# Patient Record
Sex: Male | Born: 1935 | Race: White | Hispanic: No | Smoking: Never smoker
Health system: Southern US, Community
[De-identification: ages and names within clinical notes are randomized; demographics above are authoritative.]

## PROBLEM LIST (undated history)

## (undated) DIAGNOSIS — I10 Essential (primary) hypertension: Secondary | ICD-10-CM

## (undated) DIAGNOSIS — I350 Nonrheumatic aortic (valve) stenosis: Secondary | ICD-10-CM

## (undated) DIAGNOSIS — I200 Unstable angina: Secondary | ICD-10-CM

## (undated) DIAGNOSIS — I213 ST elevation (STEMI) myocardial infarction of unspecified site: Secondary | ICD-10-CM

## (undated) DIAGNOSIS — I739 Peripheral vascular disease, unspecified: Secondary | ICD-10-CM

## (undated) DIAGNOSIS — F419 Anxiety disorder, unspecified: Secondary | ICD-10-CM

## (undated) DIAGNOSIS — I70201 Unspecified atherosclerosis of native arteries of extremities, right leg: Secondary | ICD-10-CM

## (undated) DIAGNOSIS — I340 Nonrheumatic mitral (valve) insufficiency: Secondary | ICD-10-CM

## (undated) DIAGNOSIS — N183 Chronic kidney disease, stage 3 unspecified: Secondary | ICD-10-CM

## (undated) DIAGNOSIS — E119 Type 2 diabetes mellitus without complications: Secondary | ICD-10-CM

## (undated) DIAGNOSIS — R5381 Other malaise: Secondary | ICD-10-CM

## (undated) DIAGNOSIS — I255 Ischemic cardiomyopathy: Secondary | ICD-10-CM

## (undated) DIAGNOSIS — E785 Hyperlipidemia, unspecified: Secondary | ICD-10-CM

## (undated) DIAGNOSIS — H40053 Ocular hypertension, bilateral: Secondary | ICD-10-CM

## (undated) DIAGNOSIS — K219 Gastro-esophageal reflux disease without esophagitis: Secondary | ICD-10-CM

## (undated) DIAGNOSIS — I2510 Atherosclerotic heart disease of native coronary artery without angina pectoris: Secondary | ICD-10-CM

## (undated) DIAGNOSIS — I509 Heart failure, unspecified: Secondary | ICD-10-CM

## (undated) DIAGNOSIS — D529 Folate deficiency anemia, unspecified: Secondary | ICD-10-CM

## (undated) HISTORY — PX: AORTIC VALVULOPLASTY: SHX142

---

## 2002-04-02 HISTORY — PX: AORTIC VALVE REPLACEMENT (AVR)/CORONARY ARTERY BYPASS GRAFTING (CABG): SHX5725

## 2015-11-01 ENCOUNTER — Emergency Department (HOSPITAL_COMMUNITY): Payer: Medicare Other

## 2015-11-01 ENCOUNTER — Encounter (HOSPITAL_COMMUNITY): Payer: Self-pay | Admitting: Emergency Medicine

## 2015-11-01 ENCOUNTER — Emergency Department (HOSPITAL_BASED_OUTPATIENT_CLINIC_OR_DEPARTMENT_OTHER)
Admit: 2015-11-01 | Discharge: 2015-11-01 | Disposition: A | Discharge status: Home / Self Care | Payer: Medicare Other | Attending: Emergency Medicine | Admitting: Emergency Medicine

## 2015-11-01 ENCOUNTER — Emergency Department (HOSPITAL_COMMUNITY)
Admission: EM | Admit: 2015-11-01 | Discharge: 2015-11-01 | Disposition: A | Discharge status: Other Acute Inpatient Hospital | Payer: Medicare Other | Attending: Emergency Medicine | Admitting: Emergency Medicine

## 2015-11-01 DIAGNOSIS — E1159 Type 2 diabetes mellitus with other circulatory complications: Secondary | ICD-10-CM | POA: Insufficient documentation

## 2015-11-01 DIAGNOSIS — M79604 Pain in right leg: Secondary | ICD-10-CM | POA: Diagnosis not present

## 2015-11-01 DIAGNOSIS — N183 Chronic kidney disease, stage 3 (moderate): Secondary | ICD-10-CM | POA: Diagnosis not present

## 2015-11-01 DIAGNOSIS — E1122 Type 2 diabetes mellitus with diabetic chronic kidney disease: Secondary | ICD-10-CM | POA: Diagnosis not present

## 2015-11-01 DIAGNOSIS — X58XXXA Exposure to other specified factors, initial encounter: Secondary | ICD-10-CM | POA: Diagnosis not present

## 2015-11-01 DIAGNOSIS — Y939 Activity, unspecified: Secondary | ICD-10-CM | POA: Insufficient documentation

## 2015-11-01 DIAGNOSIS — Y929 Unspecified place or not applicable: Secondary | ICD-10-CM | POA: Insufficient documentation

## 2015-11-01 DIAGNOSIS — I252 Old myocardial infarction: Secondary | ICD-10-CM | POA: Diagnosis not present

## 2015-11-01 DIAGNOSIS — I2510 Atherosclerotic heart disease of native coronary artery without angina pectoris: Secondary | ICD-10-CM | POA: Insufficient documentation

## 2015-11-01 DIAGNOSIS — S8991XA Unspecified injury of right lower leg, initial encounter: Secondary | ICD-10-CM | POA: Diagnosis present

## 2015-11-01 DIAGNOSIS — I130 Hypertensive heart and chronic kidney disease with heart failure and stage 1 through stage 4 chronic kidney disease, or unspecified chronic kidney disease: Secondary | ICD-10-CM | POA: Diagnosis not present

## 2015-11-01 DIAGNOSIS — I509 Heart failure, unspecified: Secondary | ICD-10-CM

## 2015-11-01 DIAGNOSIS — Z951 Presence of aortocoronary bypass graft: Secondary | ICD-10-CM | POA: Insufficient documentation

## 2015-11-01 DIAGNOSIS — Y999 Unspecified external cause status: Secondary | ICD-10-CM | POA: Insufficient documentation

## 2015-11-01 DIAGNOSIS — S8011XA Contusion of right lower leg, initial encounter: Secondary | ICD-10-CM | POA: Diagnosis not present

## 2015-11-01 DIAGNOSIS — I97621 Postprocedural hematoma of a circulatory system organ or structure following other procedure: Secondary | ICD-10-CM | POA: Insufficient documentation

## 2015-11-01 HISTORY — DX: Folate deficiency anemia, unspecified: D52.9

## 2015-11-01 HISTORY — DX: Type 2 diabetes mellitus without complications: E11.9

## 2015-11-01 HISTORY — DX: Atherosclerotic heart disease of native coronary artery without angina pectoris: I25.10

## 2015-11-01 HISTORY — DX: Ischemic cardiomyopathy: I25.5

## 2015-11-01 HISTORY — DX: Chronic kidney disease, stage 3 (moderate): N18.3

## 2015-11-01 HISTORY — DX: Nonrheumatic aortic (valve) stenosis: I35.0

## 2015-11-01 HISTORY — DX: Essential (primary) hypertension: I10

## 2015-11-01 HISTORY — DX: Chronic kidney disease, stage 3 unspecified: N18.30

## 2015-11-01 HISTORY — DX: Other malaise: R53.81

## 2015-11-01 HISTORY — DX: Heart failure, unspecified: I50.9

## 2015-11-01 HISTORY — DX: ST elevation (STEMI) myocardial infarction of unspecified site: I21.3

## 2015-11-01 HISTORY — DX: Nonrheumatic mitral (valve) insufficiency: I34.0

## 2015-11-01 HISTORY — DX: Unstable angina: I20.0

## 2015-11-01 HISTORY — DX: Peripheral vascular disease, unspecified: I73.9

## 2015-11-01 HISTORY — DX: Anxiety disorder, unspecified: F41.9

## 2015-11-01 HISTORY — DX: Gastro-esophageal reflux disease without esophagitis: K21.9

## 2015-11-01 HISTORY — DX: Unspecified atherosclerosis of native arteries of extremities, right leg: I70.201

## 2015-11-01 HISTORY — DX: Hyperlipidemia, unspecified: E78.5

## 2015-11-01 HISTORY — DX: Ocular hypertension, bilateral: H40.053

## 2015-11-01 LAB — CBC WITH DIFFERENTIAL/PLATELET
BASOS PCT: 0 %
Basophils Absolute: 0 10*3/uL (ref 0.0–0.1)
EOS ABS: 0.1 10*3/uL (ref 0.0–0.7)
Eosinophils Relative: 2 %
HCT: 29.6 % — ABNORMAL LOW (ref 39.0–52.0)
HEMOGLOBIN: 9.6 g/dL — AB (ref 13.0–17.0)
LYMPHS ABS: 1.2 10*3/uL (ref 0.7–4.0)
Lymphocytes Relative: 22 %
MCH: 29.7 pg (ref 26.0–34.0)
MCHC: 32.4 g/dL (ref 30.0–36.0)
MCV: 91.6 fL (ref 78.0–100.0)
Monocytes Absolute: 0.5 10*3/uL (ref 0.1–1.0)
Monocytes Relative: 9 %
NEUTROS PCT: 67 %
Neutro Abs: 3.8 10*3/uL (ref 1.7–7.7)
Platelets: 242 10*3/uL (ref 150–400)
RBC: 3.23 MIL/uL — AB (ref 4.22–5.81)
RDW: 14.1 % (ref 11.5–15.5)
WBC: 5.6 10*3/uL (ref 4.0–10.5)

## 2015-11-01 LAB — BASIC METABOLIC PANEL
Anion gap: 7 (ref 5–15)
BUN: 15 mg/dL (ref 6–20)
CHLORIDE: 106 mmol/L (ref 101–111)
CO2: 25 mmol/L (ref 22–32)
Calcium: 9.3 mg/dL (ref 8.9–10.3)
Creatinine, Ser: 1.13 mg/dL (ref 0.61–1.24)
GFR calc non Af Amer: 57 mL/min — ABNORMAL LOW (ref >60)
Glucose, Bld: 103 mg/dL — ABNORMAL HIGH (ref 65–99)
POTASSIUM: 4.3 mmol/L (ref 3.5–5.1)
SODIUM: 138 mmol/L (ref 135–145)

## 2015-11-01 LAB — I-STAT CG4 LACTIC ACID, ED
LACTIC ACID, VENOUS: 0.8 mmol/L (ref 0.5–1.9)
Lactic Acid, Venous: 1.39 mmol/L (ref 0.5–1.9)

## 2015-11-01 LAB — PROTIME-INR
INR: 1.53
PROTHROMBIN TIME: 18.5 s — AB (ref 11.4–15.2)

## 2015-11-01 MED ORDER — SODIUM CHLORIDE 0.9 % IV BOLUS (SEPSIS)
1000.0000 mL | Freq: Once | INTRAVENOUS | Status: AC
  Administered 2015-11-01: 1000 mL via INTRAVENOUS

## 2015-11-01 MED ORDER — IOPAMIDOL (ISOVUE-370) INJECTION 76%
INTRAVENOUS | Status: AC
  Administered 2015-11-01: 100 mL
  Filled 2015-11-01: qty 100

## 2015-11-01 NOTE — ED Notes (Signed)
Pt to CT at this time.

## 2015-11-01 NOTE — ED Notes (Signed)
Dr. Erma Heritage spoke with pt's son and updated him on plan of care.

## 2015-11-01 NOTE — ED Notes (Signed)
Attempted report 3'xs prior to now.  Called report to Charge Nurse Jasmine December, RN

## 2015-11-01 NOTE — Progress Notes (Signed)
VASCULAR LAB PRELIMINARY  PRELIMINARY  PRELIMINARY  PRELIMINARY  Right lower extremity duplex arterial limited has been completed.    Evaluated for pseudoaneurysm exam was limit due to banage in area patent flow in the CFA and SFA. Hematoma above the arterial vessels. No evidence of pseudoaneurysm.    Antone Summons, RVT, RDMS 11/01/2015, 5:50 PM

## 2015-11-01 NOTE — ED Notes (Signed)
Pt continues to deny any pain or discomfort,

## 2015-11-01 NOTE — ED Provider Notes (Signed)
Patient reassessed for renewal of EMTALA. Patient is alert and nontoxic. No respiratory distress. Both feet are warm and dry. Right foot has diminished distal pedal pulse. Foot is not cyanotic or cool. No ischemic changes. Patient has no additional complaints at this time. He is stable for transfer.   Arby Barrette, MD 11/01/15 2102

## 2015-11-01 NOTE — ED Triage Notes (Signed)
Pt from [facility] via [place] with c/o right leg pain and numbness s/p post op valvuloplasty on 10-16-15.  Pt reports pain is worse with movement.  Denies pain when sitting still.  12 lead EKG unremarkable.  NAD, A&O.

## 2015-11-01 NOTE — ED Provider Notes (Signed)
[facility]-EMERGENCY DEPT Provider Note   CSN: [id number] Arrival date & time: 11/01/15  1158  First Provider Contact:  First MD Initiated Contact with Patient 11/01/15 1229        History   Chief Complaint Chief Complaint  Patient presents with  . Post-op Problem    HPI Charles Cordova is a 82 y.o. male.  HPI   82 year old male with complicated past medical history including recent aortic valve repair via right femoral approach, complicated by pseudoaneurysm formation and thrombus requiring thrombectomy by vascular surgery, who presents with increasing pain and swelling of his right leg. The patient is a difficult historian, but states that he has noticed his right groin has had increased swelling over the last 24 hours. Denies any trauma to the area. Since then, he reports worsening leg pain and numbness. This has been chronic since his surgery, but it has worsened in the last 24 hours. He has also noticed a small amount of bleeding from his operative site. Denies any new weakness of the leg.  Past Medical History:  Diagnosis Date  . Anxiety   . Bilateral ocular hypertension   . CAD (coronary artery disease)   . CHF (congestive heart failure) (HCC) 11/01/2015   EF 30-35%  . CKD (chronic kidney disease), stage III   . Debility   . Diabetes mellitus without complication (HCC)   . Folate deficiency anemia   . GERD (gastroesophageal reflux disease)   . Hyperlipidemia   . Hypertension   . Ischemic cardiomyopathy   . Moderate mitral regurgitation   . Popliteal artery occlusion, right (HCC)   . PVD (peripheral vascular disease) (HCC)   . Severe aortic stenosis   . STEMI (ST elevation myocardial infarction) (HCC)   . Type 2 diabetes mellitus (HCC)   . Unstable angina (HCC)     There are no active problems to display for this patient.   Past Surgical History:  Procedure Laterality Date  . AORTIC VALVE REPLACEMENT (AVR)/CORONARY ARTERY BYPASS GRAFTING (CABG)  2004  . AORTIC  VALVULOPLASTY     severe aortic stenosis status post valvuloplasty 10-16-15       Home Medications    Prior to Admission medications   Not on File    Family History History reviewed. No pertinent family history.  Social History Social History  Substance Use Topics  . Smoking status: Never Smoker  . Smokeless tobacco: Never Used  . Alcohol use No     Allergies   Review of patient's allergies indicates no known allergies.   Review of Systems Review of Systems  Constitutional: Negative for chills, fatigue and fever.  HENT: Negative for congestion and rhinorrhea.   Eyes: Negative for visual disturbance.  Respiratory: Negative for cough, shortness of breath and wheezing.   Cardiovascular: Positive for leg swelling. Negative for chest pain.  Gastrointestinal: Negative for abdominal pain, diarrhea, nausea and vomiting.  Genitourinary: Negative for dysuria and flank pain.  Musculoskeletal: Positive for gait problem. Negative for neck pain and neck stiffness.  Skin: Negative for rash and wound.  Allergic/Immunologic: Negative for immunocompromised state.  Neurological: Positive for numbness. Negative for syncope, weakness and headaches.     Physical Exam Updated Vital Signs BP 144/74   Pulse 105   Temp 97.8 F (36.6 C) (Oral)   Resp 14   Wt 154 lb (69.9 kg)   SpO2 99%   Physical Exam  Constitutional: He is oriented to person, place, and time. He appears well-developed and well-nourished. No distress.  HENT:  Head: Normocephalic and atraumatic.  Mouth/Throat: Oropharynx is clear and moist.  Eyes: Conjunctivae are normal. Pupils are equal, round, and reactive to light.  Neck: Neck supple.  Cardiovascular: Normal rate, regular rhythm and normal heart sounds.  Exam reveals no friction rub.   No murmur heard. Pulmonary/Chest: Effort normal and breath sounds normal. No respiratory distress. He has no wheezes. He has no rales.  Abdominal: He exhibits no distension.  There is no tenderness.  Musculoskeletal: He exhibits no edema.  Neurological: He is alert and oriented to person, place, and time. He exhibits normal muscle tone.  Skin: Skin is warm. Capillary refill takes less than 2 seconds.  Nursing note and vitals reviewed.   LOWER EXTREMITY EXAM: RIGHT  INSPECTION & PALPATION: Large hematoma at operative site overlying femoral artery. Surrounding ecchymoses. Small volume dark red, dried blood with no active bleeding. Skin sutures intact. No erythema or purulence.  SENSORY: sensation is intact to light touch in:  Superficial peroneal nerve distribution (over dorsum of foot) Deep peroneal nerve distribution (over first dorsal web space) Sural nerve distribution (over lateral aspect 5th metatarsal) Saphenous nerve distribution (over medial instep)  MOTOR:  + Motor EHL (great toe dorsiflexion) + FHL (great toe plantar flexion)  + TA (ankle dorsiflexion)  + GSC (ankle plantar flexion)  VASCULAR: Doppler-able DP pulse, venous flow in PT area Toes warm, well-perfused  COMPARTMENTS: Soft, warm, well-perfused No pain with passive extension No parethesias    ED Treatments / Results  Labs (all labs ordered are listed, but only abnormal results are displayed) Labs Reviewed  CBC WITH DIFFERENTIAL/PLATELET - Abnormal; Notable for the following:       Result Value   RBC 3.23 (*)    Hemoglobin 9.6 (*)    HCT 29.6 (*)    All other components within normal limits  BASIC METABOLIC PANEL - Abnormal; Notable for the following:    Glucose, Bld 103 (*)    GFR calc non Af Amer 57 (*)    All other components within normal limits  PROTIME-INR - Abnormal; Notable for the following:    Prothrombin Time 18.5 (*)    All other components within normal limits  I-STAT CG4 LACTIC ACID, ED  I-STAT CG4 LACTIC ACID, ED    EKG  EKG Interpretation None       Radiology Ct Angio Aortobifemoral W And/or Wo Contrast  Result Date: 11/01/2015 CLINICAL  DATA:  Right groin pain. Status post recent thrombectomy at [facility]. Enlarging right groin hematoma. EXAM: CT ANGIOGRAPHY OF ABDOMINAL AORTA WITH ILIOFEMORAL RUNOFF TECHNIQUE: Multidetector CT imaging of the abdomen, pelvis and lower extremities was performed using the standard protocol during bolus administration of intravenous contrast. Multiplanar CT image reconstructions and MIPs were obtained to evaluate the vascular anatomy. CONTRAST:  100 mL Isovue 370 COMPARISON:  None. FINDINGS: Vascular Aorta: The abdominal aorta has diffuse atherosclerotic disease but no aneurysm or dissection. Celiac trunk, SMA and IMA are patent. Bilateral renal arteries are patent. There is at least mild stenosis in the proximal right renal artery due to mixed plaque. Right lower extremity: Atherosclerotic disease in the right iliac arteries. No significant stenosis in the right common or right external iliac artery. There is a hematoma in the right groin compatible with recent surgery. Hematoma in the right groin that does not extend into the intra-abdominal cavity. This hematoma measures 9.6 x 5.9 x 6.6 cm. There is concern for a small pseudoaneurysm or focus of bleeding along the inferior aspect of  this hematoma on sequence 5, image 136. This area measures roughly 1 cm in size. Mild narrowing in the right common femoral artery due to plaque. The right profunda femoral artery is heavily calcified but patent. Right SFA has diffuse calcifications but it is patent. Right popliteal artery is heavily calcified and occludes just below the knee. Right popliteal artery occludes approximately 4 cm proximal to the anterior tibial artery. Difficult to evaluate patency of the runoff vessels due to the circumferential calcifications in these vessels. There does not appear to be significant flow in the posterior tibial artery. Limited evaluation of the pedal arteries due to circumferential calcifications. The appears to be some flow in the dorsalis  pedis artery. Left lower extremity: Calcifications in the left common iliac artery without significant stenosis. Diffuse calcifications involving the left internal iliac artery. The left external iliac artery is widely patent. Left common femoral artery is heavily calcified but patent. Left profunda femoral arteries are heavily calcified but patent. There is at least mild stenosis in the proximal left SFA. Left SFA is patent with diffuse calcifications. Left popliteal artery is patent with diffuse disease. Limited evaluation of the runoff vessels due to diffuse calcifications. There appears to be flow in the distal anterior tibial artery and dorsalis pedis artery. Non Vascular Lung bases: Small left pleural effusion. Compressive atelectasis at the left lung base. Trace amount of right pleural fluid. Hepatic/biliary: Gallbladder has been removed. There is a 5.5 cm low-density structure in the left hepatic lobe which probably represents a large hepatic cyst. Main portal venous system is patent. Pancreas: Normal appearance of the pancreas without inflammation or duct dilatation. Spleen: Normal appearance of spleen without enlargement. Adrenal/ urinary tract: Normal adrenal glands. Normal appearance of both kidneys without hydronephrosis. Normal appearance of the urinary bladder. Reproductive: Large prostate measuring 7.3 x 5.0 x 5.4 cm. Seminal vesicles are prominent. Bowel structures: Small hiatal hernia. No evidence for bowel obstruction. Lymphatics:  No suspicious lymphadenopathy. Other:  No free fluid. Musculoskeletal:  No acute abnormality. IMPRESSION: Large hematoma in the right groin related to recent surgery. There is a 1 cm focus of contrast along the inferior aspect of the hematoma. Based on history of enlarging hematoma, this could represent a small pseudoaneurysm. Recommend further characterization with a vascular ultrasound. Diffuse atherosclerotic disease throughout the abdomen, pelvis and lower  extremities. Thrombosis of the distal right popliteal artery with limited evaluation of the runoff arteries due to heavily calcified runoff vessels. Left inflow and outflow vessels are patent with diffuse atherosclerotic disease. Limited evaluation of left runoff vessels due to heavily calcified arteries. Left pleural effusion with compressive atelectasis. Probable large hepatic cyst. These results were called by telephone at the time of interpretation on 11/01/2015 at 4:53 pm to Dr. Shaune Pollack , who verbally acknowledged these results. Electronically Signed   By: Richarda Overlie M.D.   On: 11/01/2015 17:00    Procedures Procedures (including critical care time)  Medications Ordered in ED Medications  sodium chloride 0.9 % bolus 1,000 mL (0 mLs Intravenous Stopped 11/01/15 1808)  iopamidol (ISOVUE-370) 76 % injection (100 mLs  Contrast Given 11/01/15 1540)     Initial Impression / Assessment and Plan / ED Course  I have reviewed the triage vital signs and the nursing notes.  Pertinent labs & imaging results that were available during my care of the patient were reviewed by me and considered in my medical decision making (see chart for details).  Clinical Course    82 yo M  s/p recent aortic valvuloplasty, c/b pseudoaneurysm and clot formation at R femoral access site, s/p thrombectomy with [facility] 10/25/15 who p/w worsening pain and enlarging hematoma to R groin site. See HPI above. On arrival, VSS and WNL. Exam is as above. Pt has warm toes, intact DP pulse but non-palpable PT pulse. No signs of acute limb ischemia. Overlying skin is not tight or compromised. Will obtain CT A/P for further assessment.  CT abdomen pelvis concerning for small amount of contrast blush along inferior aspect of the hematoma. Discussed with radiology. Findings concerning for possible expanding hematoma versus pseudoaneurysm. Vascular ultrasound performed shows no evidence of any pseudoaneurysm, however. Lab  work otherwise as above. Hemoglobin has dropped approximately 1 point from outside labs. I discussed patient's recent surgery, labs, and imaging with vascular surgeon on call, Dr. Edilia Bo. Given recent surgery at [facility] with complicated post-op course, he feels patient would be better served at [facility], which is appropriate. Medical screening exam shows no acute, life-threatening emergency. Discussed case with Dr. Sharee Holster with [facility], who has graciously accepted. Patient's son, Charles Cordova is aware and also in agreement with transfer. Discussed with Dr. Debarah Crape of [facility], who has accepted. Patient will be transferred for further management.  Final Clinical Impressions(s) / ED Diagnoses   Final diagnoses:  Hematoma of leg, right, initial encounter    New Prescriptions New Prescriptions   No medications on file     Shaune Pollack, MD 11/01/15 1920

## 2015-11-02 LAB — VAS US LOWER EXTREMITY ARTERIAL DUPLEX
RIGHT SUPER FEMORAL PROX EDV: 3 cm/s
RSFPPSV: 63 cm/s

## 2015-11-09 ENCOUNTER — Encounter (HOSPITAL_COMMUNITY): Payer: Self-pay | Admitting: Emergency Medicine

## 2015-11-09 ENCOUNTER — Emergency Department (HOSPITAL_COMMUNITY)
Admission: EM | Admit: 2015-11-09 | Discharge: 2015-11-10 | Disposition: A | Discharge status: Other Acute Inpatient Hospital | Payer: Medicare Other | Attending: Emergency Medicine | Admitting: Emergency Medicine

## 2015-11-09 DIAGNOSIS — R58 Hemorrhage, not elsewhere classified: Secondary | ICD-10-CM

## 2015-11-09 DIAGNOSIS — I130 Hypertensive heart and chronic kidney disease with heart failure and stage 1 through stage 4 chronic kidney disease, or unspecified chronic kidney disease: Secondary | ICD-10-CM | POA: Insufficient documentation

## 2015-11-09 DIAGNOSIS — I252 Old myocardial infarction: Secondary | ICD-10-CM | POA: Diagnosis not present

## 2015-11-09 DIAGNOSIS — N183 Chronic kidney disease, stage 3 (moderate): Secondary | ICD-10-CM | POA: Diagnosis not present

## 2015-11-09 DIAGNOSIS — D649 Anemia, unspecified: Secondary | ICD-10-CM | POA: Diagnosis not present

## 2015-11-09 DIAGNOSIS — M96831 Postprocedural hemorrhage and hematoma of a musculoskeletal structure following other procedure: Secondary | ICD-10-CM | POA: Insufficient documentation

## 2015-11-09 DIAGNOSIS — Z951 Presence of aortocoronary bypass graft: Secondary | ICD-10-CM | POA: Insufficient documentation

## 2015-11-09 DIAGNOSIS — I2510 Atherosclerotic heart disease of native coronary artery without angina pectoris: Secondary | ICD-10-CM | POA: Diagnosis not present

## 2015-11-09 DIAGNOSIS — I509 Heart failure, unspecified: Secondary | ICD-10-CM | POA: Diagnosis not present

## 2015-11-09 DIAGNOSIS — E1122 Type 2 diabetes mellitus with diabetic chronic kidney disease: Secondary | ICD-10-CM | POA: Insufficient documentation

## 2015-11-09 LAB — CBC WITH DIFFERENTIAL/PLATELET
Basophils Absolute: 0 10*3/uL (ref 0.0–0.1)
Basophils Relative: 0 %
EOS ABS: 0.3 10*3/uL (ref 0.0–0.7)
Eosinophils Relative: 7 %
HCT: 31.3 % — ABNORMAL LOW (ref 39.0–52.0)
Hemoglobin: 10.1 g/dL — ABNORMAL LOW (ref 13.0–17.0)
LYMPHS ABS: 1.5 10*3/uL (ref 0.7–4.0)
Lymphocytes Relative: 29 %
MCH: 29.4 pg (ref 26.0–34.0)
MCHC: 32.3 g/dL (ref 30.0–36.0)
MCV: 91.3 fL (ref 78.0–100.0)
Monocytes Absolute: 0.6 10*3/uL (ref 0.1–1.0)
Monocytes Relative: 12 %
NEUTROS ABS: 2.7 10*3/uL (ref 1.7–7.7)
NEUTROS PCT: 52 %
Platelets: 225 10*3/uL (ref 150–400)
RBC: 3.43 MIL/uL — AB (ref 4.22–5.81)
RDW: 14.2 % (ref 11.5–15.5)
WBC: 5.1 10*3/uL (ref 4.0–10.5)

## 2015-11-09 LAB — I-STAT CHEM 8, ED
BUN: 25 mg/dL — ABNORMAL HIGH (ref 6–20)
CREATININE: 1.3 mg/dL — AB (ref 0.61–1.24)
Calcium, Ion: 1.16 mmol/L (ref 1.12–1.23)
Chloride: 101 mmol/L (ref 101–111)
Glucose, Bld: 108 mg/dL — ABNORMAL HIGH (ref 65–99)
HEMATOCRIT: 30 % — AB (ref 39.0–52.0)
HEMOGLOBIN: 10.2 g/dL — AB (ref 13.0–17.0)
Potassium: 4.7 mmol/L (ref 3.5–5.1)
SODIUM: 140 mmol/L (ref 135–145)
TCO2: 27 mmol/L (ref 0–100)

## 2015-11-09 LAB — PROTIME-INR
INR: 1.3
Prothrombin Time: 16.3 seconds — ABNORMAL HIGH (ref 11.4–15.2)

## 2015-11-09 MED ORDER — ACETAMINOPHEN 500 MG PO TABS: 500.0000 mg | ORAL_TABLET | Freq: Once | ORAL | Status: DC

## 2015-11-09 MED ORDER — SODIUM CHLORIDE 0.9 % IV BOLUS (SEPSIS)
500.0000 mL | Freq: Once | INTRAVENOUS | Status: AC
  Administered 2015-11-09: 500 mL via INTRAVENOUS

## 2015-11-09 MED ORDER — ACETAMINOPHEN 325 MG PO TABS
650.0000 mg | ORAL_TABLET | Freq: Once | ORAL | Status: AC
  Administered 2015-11-09: 650 mg via ORAL
  Filled 2015-11-09: qty 2

## 2015-11-09 NOTE — ED Triage Notes (Signed)
[facility], ems reports pt had surgery for a blood clot removal from inner right thigh 9 days ago, upon staff changing the dressing this am, they reported seeing blood on patient's dressing. Ems reports pt is on blood thinners. Ems reports pt reports some discomfort of leg that he's been experiencing since surgery, no other complaints.

## 2015-11-09 NOTE — ED Notes (Signed)
Dr. Ethelda ChickJacubowitz advised this RN that after patient's lab results come back, [facility] will [facility] in order to transfer patient there.

## 2015-11-09 NOTE — ED Notes (Signed)
Notified DR J of pt's vitals, pressure is slightly soft, given order to give 500cc NS.

## 2015-11-09 NOTE — ED Provider Notes (Addendum)
[facility]-EMERGENCY DEPT Provider Note   CSN: [id number] Arrival date & time: 11/09/15  1707  First Provider Contact:  First MD Initiated Contact with Patient 11/09/15 1747        History   Chief Complaint Chief Complaint  Patient presents with  . Post-op Problem  Patient is an extremely vague historian. He presents to the emergency Department complaining of bleeding from his surgical site at right groin which occurred today. He complains of right foot pain for the past several days. Patient reportedly soaked through 2 dressings per day per nurse's note from [facility]. While he remained hemodynamically stable. He was instructed to go to [facility] emergency Department earlier today. No treatment prior to coming here. Nothing makes symptoms better or worse.  HPI Charles Cordova is a 82 y.o. male.  HPI  Past Medical History:  Diagnosis Date  . Anxiety   . Bilateral ocular hypertension   . CAD (coronary artery disease)   . CHF (congestive heart failure) (HCC) 11/01/2015   EF 30-35%  . CKD (chronic kidney disease), stage III   . Debility   . Diabetes mellitus without complication (HCC)   . Folate deficiency anemia   . GERD (gastroesophageal reflux disease)   . Hyperlipidemia   . Hypertension   . Ischemic cardiomyopathy   . Moderate mitral regurgitation   . Popliteal artery occlusion, right (HCC)   . PVD (peripheral vascular disease) (HCC)   . Severe aortic stenosis   . STEMI (ST elevation myocardial infarction) (HCC)   . Type 2 diabetes mellitus (HCC)   . Unstable angina (HCC)     There are no active problems to display for this patient.   Past Surgical History:  Procedure Laterality Date  . AORTIC VALVE REPLACEMENT (AVR)/CORONARY ARTERY BYPASS GRAFTING (CABG)  2004  . AORTIC VALVULOPLASTY     severe aortic stenosis status post valvuloplasty 10-16-15       Home Medications    Prior to Admission medications   Not on File    Family History No family history on file.  Social  History Social History  Substance Use Topics  . Smoking status: Never Smoker  . Smokeless tobacco: Never Used  . Alcohol use No     Allergies   Review of patient's allergies indicates no known allergies.   Review of Systems Review of Systems  Unable to perform ROS: Other  Musculoskeletal: Positive for arthralgias.       Right foot pain  Hematological: Bruises/bleeds easily.   Unable to perform complete review of systems as patient is extremely vague historian  Physical Exam Updated Vital Signs BP (!) 124/51   Pulse 71   Temp 97.8 F (36.6 C)   Resp 18   SpO2 100%   Physical Exam  Constitutional: He appears well-developed and well-nourished.  HENT:  Head: Normocephalic and atraumatic.  Eyes: Conjunctivae are normal. Pupils are equal, round, and reactive to light.  Neck: Neck supple. No tracheal deviation present. No thyromegaly present.  Cardiovascular: Normal rate and regular rhythm.   No murmur heard. Pulmonary/Chest: Effort normal and breath sounds normal.  Abdominal: Soft. Bowel sounds are normal. He exhibits no distension. There is no tenderness.  Musculoskeletal: Normal range of motion. He exhibits no edema or tenderness.  Right lower extremity Clean-appearing surgical wound at right groin with slight amount of fresh blood on wound. No active bleeding. Femoral pulses 2+ DP and PT pulses absent. Left lower extremity PT pulses obtainable by Doppler only. DP pulses absent. Femoral pulses  2+. There is no temperature differential between right and left lower extremities  Neurological: He is alert. Coordination normal.  Skin: Skin is warm and dry. No rash noted.  Psychiatric: He has a normal mood and affect.  Nursing note and vitals reviewed.    ED Treatments / Results  Labs (all labs ordered are listed, but only abnormal results are displayed) Labs Reviewed  CBC WITH DIFFERENTIAL/PLATELET  PROTIME-INR  I-STAT CHEM 8, ED    EKG  EKG Interpretation None        Radiology No results found.  Procedures Procedures (including critical care time)  Medications Ordered in ED Medications - No data to display Patient administered Tylenol for foot pain.  Initial Impression / Assessment and Plan / ED Course  I have reviewed the triage vital signs and the nursing notes.  Pertinent labs & imaging results that were available during my care of the patient were reviewed by me and considered in my medical decision making (see chart for details).  Clinical Course   Results for orders placed or performed during the hospital encounter of 11/09/15  CBC with Differential/Platelet  Result Value Ref Range   WBC 5.1 4.0 - 10.5 K/uL   RBC 3.43 (L) 4.22 - 5.81 MIL/uL   Hemoglobin 10.1 (L) 13.0 - 17.0 g/dL   HCT 96.0 (L) 45.4 - 09.8 %   MCV 91.3 78.0 - 100.0 fL   MCH 29.4 26.0 - 34.0 pg   MCHC 32.3 30.0 - 36.0 g/dL   RDW 11.9 14.7 - 82.9 %   Platelets 225 150 - 400 K/uL   Neutrophils Relative % 52 %   Neutro Abs 2.7 1.7 - 7.7 K/uL   Lymphocytes Relative 29 %   Lymphs Abs 1.5 0.7 - 4.0 K/uL   Monocytes Relative 12 %   Monocytes Absolute 0.6 0.1 - 1.0 K/uL   Eosinophils Relative 7 %   Eosinophils Absolute 0.3 0.0 - 0.7 K/uL   Basophils Relative 0 %   Basophils Absolute 0.0 0.0 - 0.1 K/uL  Protime-INR  Result Value Ref Range   Prothrombin Time 16.3 (H) 11.4 - 15.2 seconds   INR 1.30   I-stat chem 8, ed  Result Value Ref Range   Sodium 140 135 - 145 mmol/L   Potassium 4.7 3.5 - 5.1 mmol/L   Chloride 101 101 - 111 mmol/L   BUN 25 (H) 6 - 20 mg/dL   Creatinine, Ser 5.62 (H) 0.61 - 1.24 mg/dL   Glucose, Bld 130 (H) 65 - 99 mg/dL   Calcium, Ion 8.65 7.84 - 1.23 mmol/L   TCO2 27 0 - 100 mmol/L   Hemoglobin 10.2 (L) 13.0 - 17.0 g/dL   HCT 69.6 (L) 29.5 - 28.4 %   Ct Angio Aortobifemoral W And/or Wo Contrast  Result Date: 11/01/2015 CLINICAL DATA:  Right groin pain. Status post recent thrombectomy at [facility]. Enlarging right groin hematoma. EXAM: CT  ANGIOGRAPHY OF ABDOMINAL AORTA WITH ILIOFEMORAL RUNOFF TECHNIQUE: Multidetector CT imaging of the abdomen, pelvis and lower extremities was performed using the standard protocol during bolus administration of intravenous contrast. Multiplanar CT image reconstructions and MIPs were obtained to evaluate the vascular anatomy. CONTRAST:  100 mL Isovue 370 COMPARISON:  None. FINDINGS: Vascular Aorta: The abdominal aorta has diffuse atherosclerotic disease but no aneurysm or dissection. Celiac trunk, SMA and IMA are patent. Bilateral renal arteries are patent. There is at least mild stenosis in the proximal right renal artery due to mixed plaque. Right lower extremity: Atherosclerotic  disease in the right iliac arteries. No significant stenosis in the right common or right external iliac artery. There is a hematoma in the right groin compatible with recent surgery. Hematoma in the right groin that does not extend into the intra-abdominal cavity. This hematoma measures 9.6 x 5.9 x 6.6 cm. There is concern for a small pseudoaneurysm or focus of bleeding along the inferior aspect of this hematoma on sequence 5, image 136. This area measures roughly 1 cm in size. Mild narrowing in the right common femoral artery due to plaque. The right profunda femoral artery is heavily calcified but patent. Right SFA has diffuse calcifications but it is patent. Right popliteal artery is heavily calcified and occludes just below the knee. Right popliteal artery occludes approximately 4 cm proximal to the anterior tibial artery. Difficult to evaluate patency of the runoff vessels due to the circumferential calcifications in these vessels. There does not appear to be significant flow in the posterior tibial artery. Limited evaluation of the pedal arteries due to circumferential calcifications. The appears to be some flow in the dorsalis pedis artery. Left lower extremity: Calcifications in the left common iliac artery without significant  stenosis. Diffuse calcifications involving the left internal iliac artery. The left external iliac artery is widely patent. Left common femoral artery is heavily calcified but patent. Left profunda femoral arteries are heavily calcified but patent. There is at least mild stenosis in the proximal left SFA. Left SFA is patent with diffuse calcifications. Left popliteal artery is patent with diffuse disease. Limited evaluation of the runoff vessels due to diffuse calcifications. There appears to be flow in the distal anterior tibial artery and dorsalis pedis artery. Non Vascular Lung bases: Small left pleural effusion. Compressive atelectasis at the left lung base. Trace amount of right pleural fluid. Hepatic/biliary: Gallbladder has been removed. There is a 5.5 cm low-density structure in the left hepatic lobe which probably represents a large hepatic cyst. Main portal venous system is patent. Pancreas: Normal appearance of the pancreas without inflammation or duct dilatation. Spleen: Normal appearance of spleen without enlargement. Adrenal/ urinary tract: Normal adrenal glands. Normal appearance of both kidneys without hydronephrosis. Normal appearance of the urinary bladder. Reproductive: Large prostate measuring 7.3 x 5.0 x 5.4 cm. Seminal vesicles are prominent. Bowel structures: Small hiatal hernia. No evidence for bowel obstruction. Lymphatics:  No suspicious lymphadenopathy. Other:  No free fluid. Musculoskeletal:  No acute abnormality. IMPRESSION: Large hematoma in the right groin related to recent surgery. There is a 1 cm focus of contrast along the inferior aspect of the hematoma. Based on history of enlarging hematoma, this could represent a small pseudoaneurysm. Recommend further characterization with a vascular ultrasound. Diffuse atherosclerotic disease throughout the abdomen, pelvis and lower extremities. Thrombosis of the distal right popliteal artery with limited evaluation of the runoff arteries due  to heavily calcified runoff vessels. Left inflow and outflow vessels are patent with diffuse atherosclerotic disease. Limited evaluation of left runoff vessels due to heavily calcified arteries. Left pleural effusion with compressive atelectasis. Probable large hepatic cyst. These results were called by telephone at the time of interpretation on 11/01/2015 at 4:53 pm to Dr. Shaune PollackAMERON ISAACS , who verbally acknowledged these results. Electronically Signed   By: Richarda OverlieAdam  Henn M.D.   On: 11/01/2015 17:00   I consulted Dr. Margo AyeHall, [facility] He states that patient's right foot pain is baseline for him. Likely the patient bled from hematoma. He had from right groin  pseudoaneurysm evacuated 10/24/2015. I also spoke with Dr. Shella Spearing [facility] for, [facility] emergency physician who accepts patient in transfer 10:55 PM pain is improved after treatment with Tylenol. Hemoglobin is stable for this patient Final Clinical Impressions(s) / ED Diagnoses  Diagnosis#1 postoperative bleeding #2 anemia Final diagnoses:  None    New Prescriptions New Prescriptions   No medications on file     Doug Sou, MD 11/09/15 2258 Addendum 11:40 PM patient's blood pressure to be 88/56 with pulse of 70. We will gently hydrateWith 500 mL intravenous saline bolus. He's had no further bleeding from his groin   Doug Sou, MD 11/09/15 2345    Doug Sou, MD 11/09/15 [phone]

## 2015-11-10 DIAGNOSIS — N183 Chronic kidney disease, stage 3 (moderate): Secondary | ICD-10-CM | POA: Diagnosis not present

## 2015-11-10 DIAGNOSIS — Z951 Presence of aortocoronary bypass graft: Secondary | ICD-10-CM | POA: Diagnosis not present

## 2015-11-10 DIAGNOSIS — I130 Hypertensive heart and chronic kidney disease with heart failure and stage 1 through stage 4 chronic kidney disease, or unspecified chronic kidney disease: Secondary | ICD-10-CM | POA: Diagnosis not present

## 2015-11-10 DIAGNOSIS — I509 Heart failure, unspecified: Secondary | ICD-10-CM | POA: Diagnosis not present

## 2015-11-10 DIAGNOSIS — E1122 Type 2 diabetes mellitus with diabetic chronic kidney disease: Secondary | ICD-10-CM | POA: Diagnosis not present

## 2015-11-10 DIAGNOSIS — I2510 Atherosclerotic heart disease of native coronary artery without angina pectoris: Secondary | ICD-10-CM | POA: Diagnosis not present

## 2015-11-10 DIAGNOSIS — M96831 Postprocedural hemorrhage and hematoma of a musculoskeletal structure following other procedure: Secondary | ICD-10-CM | POA: Diagnosis present

## 2015-11-10 DIAGNOSIS — I252 Old myocardial infarction: Secondary | ICD-10-CM | POA: Diagnosis not present

## 2015-11-10 DIAGNOSIS — D649 Anemia, unspecified: Secondary | ICD-10-CM | POA: Diagnosis not present

## 2016-07-01 DEATH — deceased

## 2017-07-16 IMAGING — CT CT ANGIO AOBIFEM WO/W CM
2 of 11 series · 12 of 46 positions shown, 14 images · IV contrast (isovue)
Comparison: None.

CLINICAL DATA: Right groin pain. Status post recent thrombectomy at
[HOSPITAL]. Enlarging right groin hematoma.

EXAM:
CT ANGIOGRAPHY OF ABDOMINAL AORTA WITH ILIOFEMORAL RUNOFF
TECHNIQUE: Multidetector CT imaging of the abdomen, pelvis and lower
extremities was performed using the standard protocol during bolus
administration of intravenous contrast. Multiplanar CT image
reconstructions and MIPs were obtained to evaluate the vascular
anatomy.
CONTRAST:  100 mL Isovue 370

[Series 5: angiorunoff 3.0 i30s 3 · axial · 0.81mm/px · z∈[+143,+1379]mm · 11 of 460 slices shown, 13 images]
[im 32/460  soft-tissue]
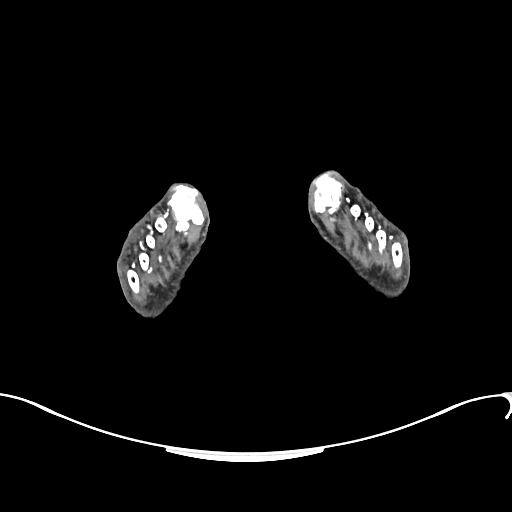
[im 32/460  bone]
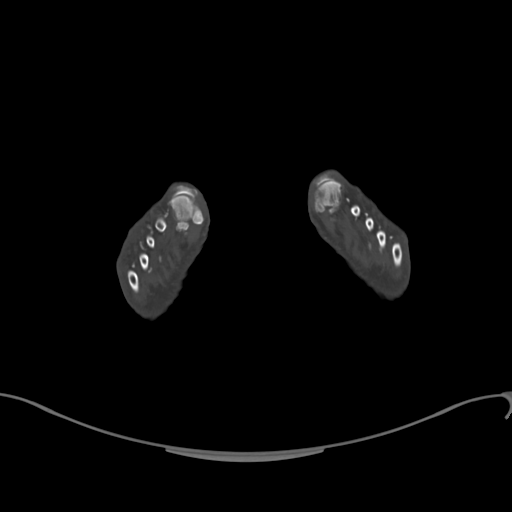
[im 95/460  soft-tissue]
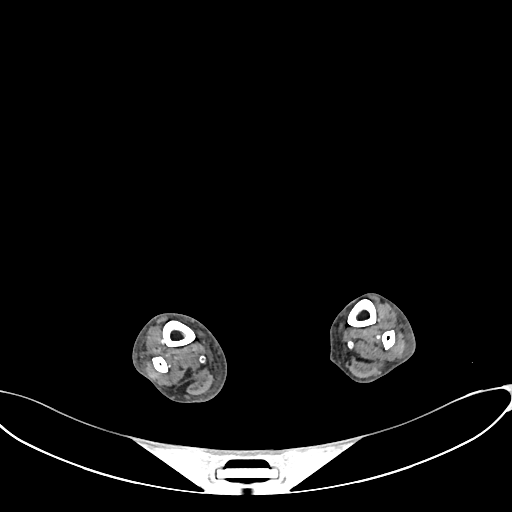
[im 143/460  soft-tissue]
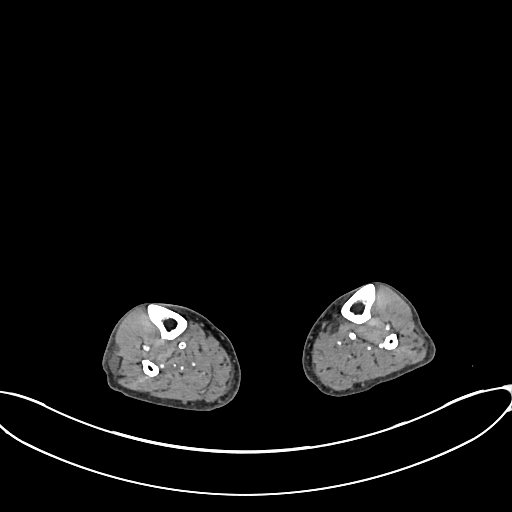
[im 206/460  soft-tissue]
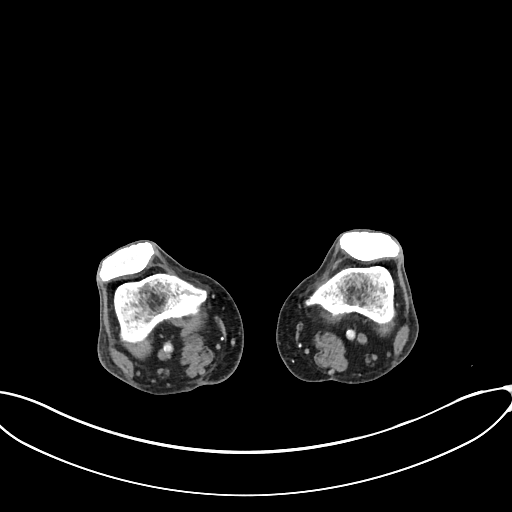
[im 254/460  soft-tissue]
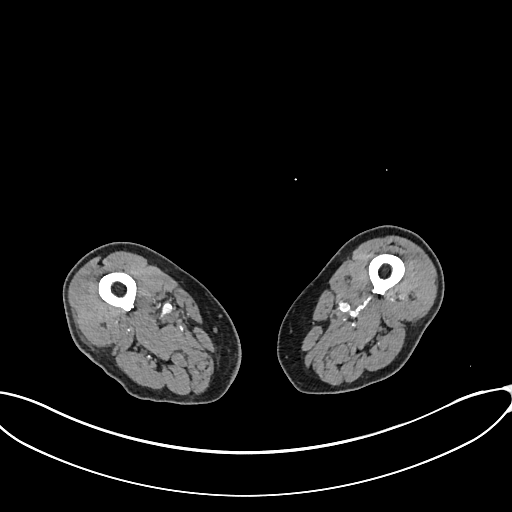
[im 317/460  soft-tissue]
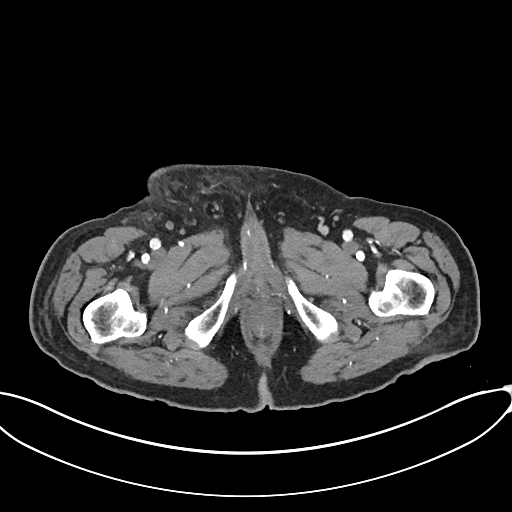
[im 365/460  soft-tissue]
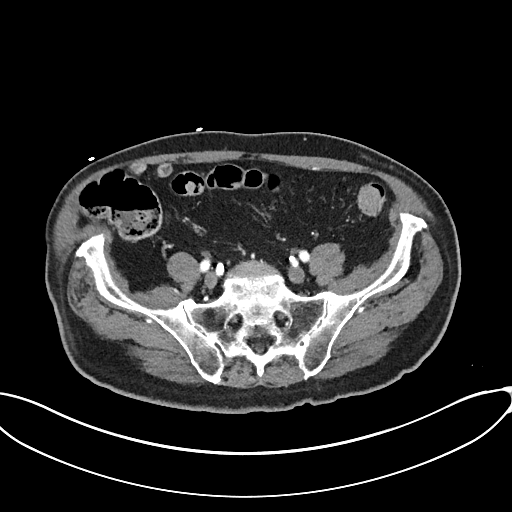
[im 396/460  lung]
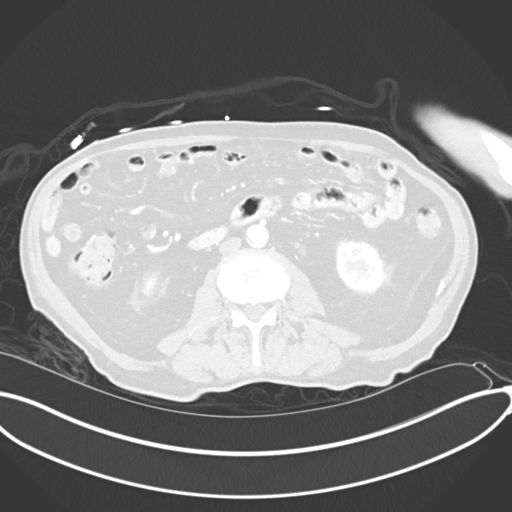
[im 412/460  lung]
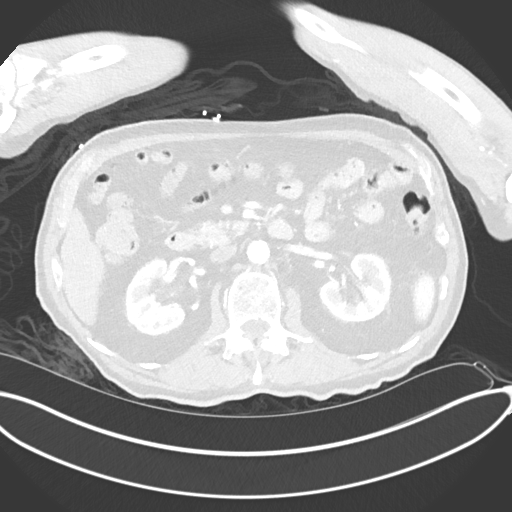
[im 428/460  soft-tissue]
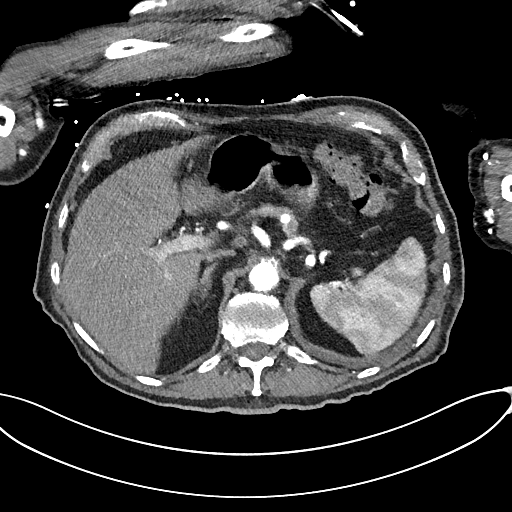
[im 428/460  lung]
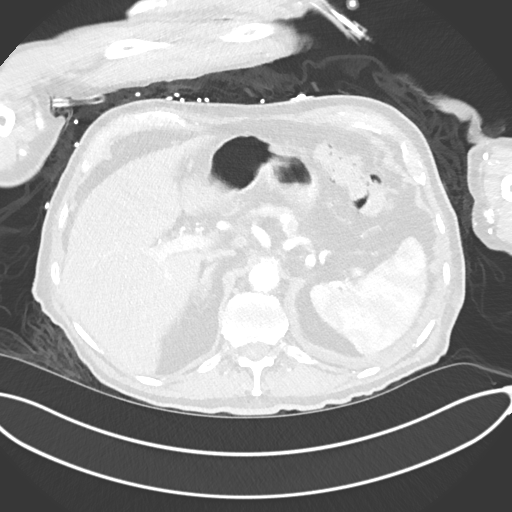
[im 444/460  lung]
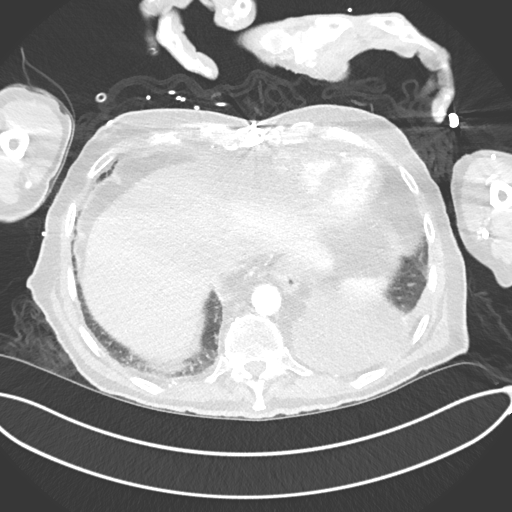

[Series 11: coronal lower · coronal · 0.92mm/px · 1 of 151 slices shown]
[im 76/151  soft-tissue]
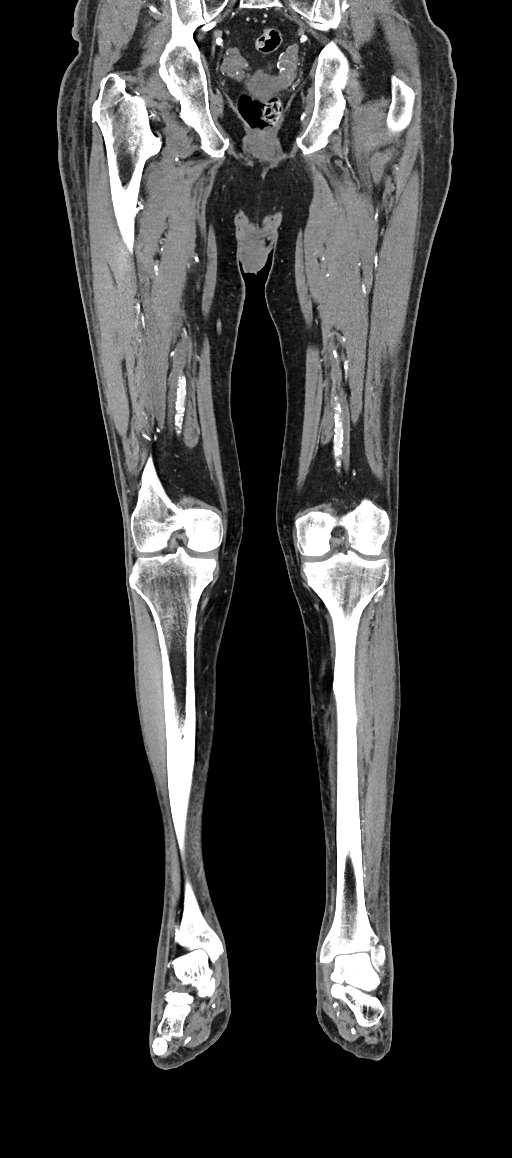

[12 of 46 positions shown; findings below may reference images not displayed]

FINDINGS: Vascular

Aorta: The abdominal aorta has diffuse atherosclerotic disease but
no aneurysm or dissection. Celiac trunk, SMA and IMA are patent.
Bilateral renal arteries are patent. There is at least mild stenosis
in the proximal right renal artery due to mixed plaque.

Right lower extremity: Atherosclerotic disease in the right iliac
arteries. No significant stenosis in the right common or right
external iliac artery. There is a hematoma in the right groin
compatible with recent surgery. Hematoma in the right groin that
does not extend into the intra-abdominal cavity. This hematoma
measures 9.6 x 5.9 x 6.6 cm. There is concern for a small
pseudoaneurysm or focus of bleeding along the inferior aspect of
this hematoma on sequence 5, image 136. This area measures roughly 1
cm in size. Mild narrowing in the right common femoral artery due to
plaque. The right profunda femoral artery is heavily calcified but
patent. Right SFA has diffuse calcifications but it is patent. Right
popliteal artery is heavily calcified and occludes just below the
knee. Right popliteal artery occludes approximately 4 cm proximal to
the anterior tibial artery. Difficult to evaluate patency of the
runoff vessels due to the circumferential calcifications in these
vessels. There does not appear to be significant flow in the
posterior tibial artery. Limited evaluation of the pedal arteries
due to circumferential calcifications. The appears to be some flow
in the dorsalis pedis artery.

Left lower extremity: Calcifications in the left common iliac artery
without significant stenosis. Diffuse calcifications involving the
left internal iliac artery. The left external iliac artery is widely
patent. Left common femoral artery is heavily calcified but patent.
Left profunda femoral arteries are heavily calcified but patent.
There is at least mild stenosis in the proximal left SFA. Left SFA
is patent with diffuse calcifications. Left popliteal artery is
patent with diffuse disease. Limited evaluation of the runoff
vessels due to diffuse calcifications. There appears to be flow in
the distal anterior tibial artery and dorsalis pedis artery.

Non Vascular

Lung bases: Small left pleural effusion. Compressive atelectasis at
the left lung base. Trace amount of right pleural fluid.

Hepatic/biliary: Gallbladder has been removed. There is a 5.5 cm
low-density structure in the left hepatic lobe which probably
represents a large hepatic cyst. Main portal venous system is
patent.

Pancreas: Normal appearance of the pancreas without inflammation or
duct dilatation.

Spleen: Normal appearance of spleen without enlargement.

Adrenal/ urinary tract: Normal adrenal glands. Normal appearance of
both kidneys without hydronephrosis. Normal appearance of the
urinary bladder.

Reproductive: Large prostate measuring 7.3 x 5.0 x 5.4 cm. Seminal
vesicles are prominent.

Bowel structures: Small hiatal hernia. No evidence for bowel
obstruction.

Lymphatics:  No suspicious lymphadenopathy.

Other:  No free fluid.

Musculoskeletal:  No acute abnormality.
IMPRESSION: Large hematoma in the right groin related to recent surgery. There
is a 1 cm focus of contrast along the inferior aspect of the
hematoma. Based on history of enlarging hematoma, this could
represent a small pseudoaneurysm. Recommend further characterization
with a vascular ultrasound.

Diffuse atherosclerotic disease throughout the abdomen, pelvis and
lower extremities. Thrombosis of the distal right popliteal artery
with limited evaluation of the runoff arteries due to heavily
calcified runoff vessels.

Left inflow and outflow vessels are patent with diffuse
atherosclerotic disease. Limited evaluation of left runoff vessels
due to heavily calcified arteries.

Left pleural effusion with compressive atelectasis.

Probable large hepatic cyst.

These results were called by telephone at the time of interpretation
on 11/01/2015 at [DATE] to Dr. GIORGI JUMPER , who verbally
acknowledged these results.
